# Patient Record
Sex: Male | Born: 1965 | Race: White | Hispanic: No | Marital: Single | Smoking: Current every day smoker
Health system: Southern US, Community
[De-identification: ages and names within clinical notes are randomized; demographics above are authoritative.]

## PROBLEM LIST (undated history)

## (undated) HISTORY — PX: ABDOMINAL SURGERY: SHX537

---

## 2013-11-08 ENCOUNTER — Emergency Department (HOSPITAL_COMMUNITY)
Admission: EM | Admit: 2013-11-08 | Discharge: 2013-11-08 | Disposition: A | Discharge status: Home / Self Care | Payer: Self-pay | Attending: Emergency Medicine | Admitting: Emergency Medicine

## 2013-11-08 ENCOUNTER — Encounter (HOSPITAL_COMMUNITY): Payer: Self-pay | Admitting: Emergency Medicine

## 2013-11-08 ENCOUNTER — Emergency Department (HOSPITAL_COMMUNITY): Payer: Self-pay

## 2013-11-08 DIAGNOSIS — Y929 Unspecified place or not applicable: Secondary | ICD-10-CM | POA: Insufficient documentation

## 2013-11-08 DIAGNOSIS — W11XXXA Fall on and from ladder, initial encounter: Secondary | ICD-10-CM | POA: Insufficient documentation

## 2013-11-08 DIAGNOSIS — F17200 Nicotine dependence, unspecified, uncomplicated: Secondary | ICD-10-CM | POA: Insufficient documentation

## 2013-11-08 DIAGNOSIS — W19XXXA Unspecified fall, initial encounter: Secondary | ICD-10-CM

## 2013-11-08 DIAGNOSIS — Y9389 Activity, other specified: Secondary | ICD-10-CM | POA: Insufficient documentation

## 2013-11-08 DIAGNOSIS — S300XXA Contusion of lower back and pelvis, initial encounter: Secondary | ICD-10-CM

## 2013-11-08 DIAGNOSIS — S20229A Contusion of unspecified back wall of thorax, initial encounter: Secondary | ICD-10-CM | POA: Insufficient documentation

## 2013-11-08 MED ORDER — CYCLOBENZAPRINE HCL 10 MG PO TABS: 10.0000 mg | ORAL_TABLET | Freq: Two times a day (BID) | ORAL | Status: DC | PRN

## 2013-11-08 MED ORDER — HYDROCODONE-ACETAMINOPHEN 5-325 MG PO TABS
1.0000 | ORAL_TABLET | Freq: Once | ORAL | Status: AC
  Administered 2013-11-08: 1 via ORAL
  Filled 2013-11-08: qty 1

## 2013-11-08 MED ORDER — HYDROCODONE-ACETAMINOPHEN 5-325 MG PO TABS: 2.0000 | ORAL_TABLET | ORAL | Status: DC | PRN

## 2013-11-08 NOTE — ED Notes (Signed)
Pt states he fell from approximately 5 foot tall ladder around 1800. Pt states struck lower back against unknown object. C/o increased pain in lower spin

## 2013-11-08 NOTE — ED Provider Notes (Signed)
CSN: [id number]     Arrival date & time 11/08/13  1191 History   First MD Initiated Contact with Patient 11/08/13 0559     Chief Complaint  Patient presents with  . Fall   (Consider location/radiation/quality/duration/timing/severity/associated sxs/prior Treatment) HPI Comments: The patient is a 47 y.o. male presenting the Emergency Department with a chief complaint of fall from a ladder yesterday.  The patient reports he was trying to hang Christmas lights when he lost his balance and fell from 4 feet height.  He reports hitting his lower back on a tree root.  He reports an increase in pain since last night. He denies hitting his head or LOC.  He denies other injury.  He reports he has been able to urinate and has had a bowel movement yesterday.    Patient is a 47 y.o. male presenting with fall. The history is provided by the patient. No language interpreter was used.  Fall This is a new problem. The current episode started yesterday. The problem occurs rarely. The problem has been gradually worsening. Pertinent negatives include no abdominal pain, change in bowel habit, chest pain, chills, coughing, fever, headaches, nausea, neck pain, numbness, rash, urinary symptoms, vertigo, visual change, vomiting or weakness. The symptoms are aggravated by walking, stress, twisting and bending. He has tried NSAIDs for the symptoms. The treatment provided no relief.    History reviewed. No pertinent past medical history. History reviewed. No pertinent past surgical history. No family history on file. History  Substance Use Topics  . Smoking status: Current Every Day Smoker -- 0.50 packs/day    Types: Cigarettes  . Smokeless tobacco: Not on file  . Alcohol Use: Not on file    Review of Systems  Constitutional: Negative for fever and chills.  Respiratory: Negative for cough.   Cardiovascular: Negative for chest pain and leg swelling.  Gastrointestinal: Negative for nausea, vomiting, abdominal  pain, diarrhea, constipation and change in bowel habit.  Musculoskeletal: Positive for back pain. Negative for neck pain.  Skin: Negative for rash.  Neurological: Negative for vertigo, syncope, weakness, numbness and headaches.  All other systems reviewed and are negative.    Allergies  Review of patient's allergies indicates not on file.  Home Medications   Current Outpatient Rx  Name  Route  Sig  Dispense  Refill  . cyclobenzaprine (FLEXERIL) 10 MG tablet   Oral   Take 1 tablet (10 mg total) by mouth 2 (two) times daily as needed for muscle spasms.   20 tablet   0   . HYDROcodone-acetaminophen (NORCO/VICODIN) 5-325 MG per tablet   Oral   Take 2 tablets by mouth every 4 (four) hours as needed.   10 tablet   0   . ibuprofen (ADVIL,MOTRIN) 200 MG tablet   Oral   Take 600 mg by mouth every 6 (six) hours as needed for mild pain or moderate pain.          BP 124/83  Pulse 76  Resp 18  Ht 5\' 7"  (1.702 m)  Wt 165 lb (74.844 kg)  BMI 25.84 kg/m2  SpO2 98% Physical Exam  Nursing note and vitals reviewed. Constitutional: He is oriented to person, place, and time. He appears well-developed and well-nourished. No distress.  Appears uncomfortable  HENT:  Head: Normocephalic and atraumatic.  Eyes: EOM are normal. Pupils are equal, round, and reactive to light. No scleral icterus.  Neck: Normal range of motion. Neck supple.  Cardiovascular: Normal rate, regular rhythm and normal heart  sounds.   No murmur heard. Not tachycardic on exam  Pulmonary/Chest: Effort normal and breath sounds normal. He has no wheezes.  Abdominal: Soft. Bowel sounds are normal. There is no tenderness. There is no rebound and no guarding.  Musculoskeletal: Normal range of motion. He exhibits no edema.       Lumbar back: He exhibits tenderness and bony tenderness. He exhibits no swelling, no edema and no deformity.       Back:  Neurological: He is alert and oriented to person, place, and time. No  sensory deficit.  Reflex Scores:      Patellar reflexes are 2+ on the right side and 2+ on the left side.      Achilles reflexes are 2+ on the right side and 2+ on the left side. Good strength in lower extremities.  No sensory deficit, Good DTR.  Skin: Skin is warm and dry. No rash noted.  Psychiatric: He has a normal mood and affect.    ED Course  Procedures (including critical care time) Labs Review Labs Reviewed - No data to display Imaging Review No results found.  EKG Interpretation   None       MDM   1. Fall, initial encounter   2. Contusion of lower back, initial encounter    Pt with a mechanical fall, No red flags on exam.  XR without evidence of fracture or malalignment. Pain medication, muscle relaxants and return precaution given. Discussed imaging results, and treatment plan with the patient.  He reports understanding and no other concerns at this time.  Patient is stable for discharge at this time.     Clabe Seal, PA-C 11/10/13 [phone]

## 2013-11-08 NOTE — ED Notes (Signed)
PA at bedside.

## 2013-11-15 NOTE — ED Provider Notes (Signed)
Medical screening examination/treatment/procedure(s) were performed by non-physician practitioner and as supervising physician I was immediately available for consultation/collaboration.  EKG Interpretation   None        Kenishia Plack K Edelmiro Innocent-Rasch, MD 11/15/13 [phone]

## 2014-08-20 ENCOUNTER — Encounter (HOSPITAL_COMMUNITY): Payer: Self-pay | Admitting: Emergency Medicine

## 2014-08-20 ENCOUNTER — Emergency Department (HOSPITAL_COMMUNITY)
Admission: EM | Admit: 2014-08-20 | Discharge: 2014-08-20 | Disposition: A | Discharge status: Home / Self Care | Payer: Self-pay | Attending: Emergency Medicine | Admitting: Emergency Medicine

## 2014-08-20 DIAGNOSIS — Y9389 Activity, other specified: Secondary | ICD-10-CM | POA: Insufficient documentation

## 2014-08-20 DIAGNOSIS — F17200 Nicotine dependence, unspecified, uncomplicated: Secondary | ICD-10-CM | POA: Insufficient documentation

## 2014-08-20 DIAGNOSIS — X503XXA Overexertion from repetitive movements, initial encounter: Secondary | ICD-10-CM | POA: Insufficient documentation

## 2014-08-20 DIAGNOSIS — IMO0002 Reserved for concepts with insufficient information to code with codable children: Secondary | ICD-10-CM | POA: Insufficient documentation

## 2014-08-20 DIAGNOSIS — X500XXA Overexertion from strenuous movement or load, initial encounter: Secondary | ICD-10-CM | POA: Insufficient documentation

## 2014-08-20 DIAGNOSIS — M545 Low back pain, unspecified: Secondary | ICD-10-CM

## 2014-08-20 DIAGNOSIS — Y9289 Other specified places as the place of occurrence of the external cause: Secondary | ICD-10-CM | POA: Insufficient documentation

## 2014-08-20 DIAGNOSIS — W108XXA Fall (on) (from) other stairs and steps, initial encounter: Secondary | ICD-10-CM | POA: Insufficient documentation

## 2014-08-20 MED ORDER — OXYCODONE-ACETAMINOPHEN 5-325 MG PO TABS: 1.0000 | ORAL_TABLET | Freq: Four times a day (QID) | ORAL | Status: AC | PRN

## 2014-08-20 MED ORDER — OXYCODONE-ACETAMINOPHEN 5-325 MG PO TABS
2.0000 | ORAL_TABLET | Freq: Once | ORAL | Status: AC
  Administered 2014-08-20: 2 via ORAL
  Filled 2014-08-20: qty 2

## 2014-08-20 MED ORDER — DIAZEPAM 5 MG PO TABS
10.0000 mg | ORAL_TABLET | Freq: Once | ORAL | Status: AC
  Administered 2014-08-20: 10 mg via ORAL
  Filled 2014-08-20: qty 2

## 2014-08-20 MED ORDER — ONDANSETRON 8 MG PO TBDP
8.0000 mg | ORAL_TABLET | Freq: Once | ORAL | Status: AC
  Administered 2014-08-20: 8 mg via ORAL
  Filled 2014-08-20: qty 1

## 2014-08-20 MED ORDER — DIAZEPAM 5 MG PO TABS: 5.0000 mg | ORAL_TABLET | Freq: Two times a day (BID) | ORAL | Status: AC

## 2014-08-20 MED ORDER — ONDANSETRON HCL 4 MG PO TABS: 4.0000 mg | ORAL_TABLET | Freq: Four times a day (QID) | ORAL | Status: AC

## 2014-08-20 NOTE — Discharge Instructions (Signed)
Back Pain, Adult °Low back pain is very common. About 1 in 5 people have back pain. The cause of low back pain is rarely dangerous. The pain often gets better over time. About half of people with a sudden onset of back pain feel better in just 2 weeks. About 8 in 10 people feel better by 6 weeks.  °CAUSES °Some common causes of back pain include: °· Strain of the muscles or ligaments supporting the spine. °· Wear and tear (degeneration) of the spinal discs. °· Arthritis. °· Direct injury to the back. °DIAGNOSIS °Most of the time, the direct cause of low back pain is not known. However, back pain can be treated effectively even when the exact cause of the pain is unknown. Answering your caregiver's questions about your overall health and symptoms is one of the most accurate ways to make sure the cause of your pain is not dangerous. If your caregiver needs more information, he or she may order lab work or imaging tests (X-rays or MRIs). However, even if imaging tests show changes in your back, this usually does not require surgery. °HOME CARE INSTRUCTIONS °For many people, back pain returns. Since low back pain is rarely dangerous, it is often a condition that people can learn to manage on their own.  °· Remain active. It is stressful on the back to sit or stand in one place. Do not sit, drive, or stand in one place for more than 30 minutes at a time. Take short walks on level surfaces as soon as pain allows. Try to increase the length of time you walk each day. °· Do not stay in bed. Resting more than 1 or 2 days can delay your recovery. °· Do not avoid exercise or work. Your body is made to move. It is not dangerous to be active, even though your back may hurt. Your back will likely heal faster if you return to being active before your pain is gone. °· Pay attention to your body when you  bend and lift. Many people have less discomfort when lifting if they bend their knees, keep the load close to their bodies, and  avoid twisting. Often, the most comfortable positions are those that put less stress on your recovering back. °· Find a comfortable position to sleep. Use a firm mattress and lie on your side with your knees slightly bent. If you lie on your back, put a pillow under your knees. °· Only take over-the-counter or prescription medicines as directed by your caregiver. Over-the-counter medicines to reduce pain and inflammation are often the most helpful. Your caregiver may prescribe muscle relaxant drugs. These medicines help dull your pain so you can more quickly return to your normal activities and healthy exercise. °· Put ice on the injured area. °¨ Put ice in a plastic bag. °¨ Place a towel between your skin and the bag. °¨ Leave the ice on for 15-20 minutes, 03-04 times a day for the first 2 to 3 days. After that, ice and heat may be alternated to reduce pain and spasms. °· Ask your caregiver about trying back exercises and gentle massage. This may be of some benefit. °· Avoid feeling anxious or stressed. Stress increases muscle tension and can worsen back pain. It is important to recognize when you are anxious or stressed and learn ways to manage it. Exercise is a great option. °SEEK MEDICAL CARE IF: °· You have pain that is not relieved with rest or medicine. °· You have pain that does not improve in 1 week. °· You have new symptoms. °· You are generally not feeling well. °SEEK   IMMEDIATE MEDICAL CARE IF:  °· You have pain that radiates from your back into your legs. °· You develop new bowel or bladder control problems. °· You have unusual weakness or numbness in your arms or legs. °· You develop nausea or vomiting. °· You develop abdominal pain. °· You feel faint. °Document Released: 11/15/2005 Document Revised: 05/16/2012 Document Reviewed: 03/19/2014 °Exit[employer]® Patient Information ©2015 [employer]. This information is not intended to replace advice given to you by your health care provider. Make sure you  discuss any questions you have with your health care provider. ° °Back Exercises °These exercises may help you when beginning to rehabilitate your injury. Your symptoms may resolve with or without further involvement from your physician, physical therapist or athletic trainer. While completing these exercises, remember:  °· Restoring tissue flexibility helps normal motion to return to the joints. This allows healthier, less painful movement and activity. °· An effective stretch should be held for at least 30 seconds. °· A stretch should never be painful. You should only feel a gentle lengthening or release in the stretched tissue. °STRETCH - Extension, Prone on Elbows  °· Lie on your stomach on the floor, a bed will be too soft. Place your palms about shoulder width apart and at the height of your head. °· Place your elbows under your shoulders. If this is too painful, stack pillows under your chest. °· Allow your body to relax so that your hips drop lower and make contact more completely with the floor. °· Hold this position for __________ seconds. °· Slowly return to lying flat on the floor. °Repeat __________ times. Complete this exercise __________ times per day.  °RANGE OF MOTION - Extension, Prone Press Ups  °· Lie on your stomach on the floor, a bed will be too soft. Place your palms about shoulder width apart and at the height of your head. °· Keeping your back as relaxed as possible, slowly straighten your elbows while keeping your hips on the floor. You may adjust the placement of your hands to maximize your comfort. As you gain motion, your hands will come more underneath your shoulders. °· Hold this position __________ seconds. °· Slowly return to lying flat on the floor. °Repeat __________ times. Complete this exercise __________ times per day.  °RANGE OF MOTION- Quadruped, Neutral Spine  °· Assume a hands and knees position on a firm surface. Keep your hands under your shoulders and your knees under  your hips. You may place padding under your knees for comfort. °· Drop your head and point your tail bone toward the ground below you. This will round out your low back like an angry cat. Hold this position for __________ seconds. °· Slowly lift your head and release your tail bone so that your back sags into a large arch, like an old horse. °· Hold this position for __________ seconds. °· Repeat this until you feel limber in your low back. °· Now, find your "sweet spot." This will be the most comfortable position somewhere between the two previous positions. This is your neutral spine. Once you have found this position, tense your stomach muscles to support your low back. °· Hold this position for __________ seconds. °Repeat __________ times. Complete this exercise __________ times per day.  °STRETCH - Flexion, Single Knee to Chest  °· Lie on a firm bed or floor with both legs extended in front of you. °· Keeping one leg in contact with the floor, bring your opposite knee to your   chest. Hold your leg in place by either grabbing behind your thigh or at your knee. °· Pull until you feel a gentle stretch in your low back. Hold __________ seconds. °· Slowly release your grasp and repeat the exercise with the opposite side. °Repeat __________ times. Complete this exercise __________ times per day.  °STRETCH - Hamstrings, Standing °· Stand or sit and extend your right / left leg, placing your foot on a chair or foot stool °· Keeping a slight arch in your low back and your hips straight forward. °· Lead with your chest and lean forward at the waist until you feel a gentle stretch in the back of your right / left knee or thigh. (When done correctly, this exercise requires leaning only a small distance.) °· Hold this position for __________ seconds. °Repeat __________ times. Complete this stretch __________ times per day. °STRENGTHENING - Deep Abdominals, Pelvic Tilt  °· Lie on a firm bed or floor. Keeping your legs in  front of you, bend your knees so they are both pointed toward the ceiling and your feet are flat on the floor. °· Tense your lower abdominal muscles to press your low back into the floor. This motion will rotate your pelvis so that your tail bone is scooping upwards rather than pointing at your feet or into the floor. °· With a gentle tension and even breathing, hold this position for __________ seconds. °Repeat __________ times. Complete this exercise __________ times per day.  °STRENGTHENING - Abdominals, Crunches  °· Lie on a firm bed or floor. Keeping your legs in front of you, bend your knees so they are both pointed toward the ceiling and your feet are flat on the floor. Cross your arms over your chest. °· Slightly tip your chin down without bending your neck. °· Tense your abdominals and slowly lift your trunk high enough to just clear your shoulder blades. Lifting higher can put excessive stress on the low back and does not further strengthen your abdominal muscles. °· Control your return to the starting position. °Repeat __________ times. Complete this exercise __________ times per day.  °STRENGTHENING - Quadruped, Opposite UE/LE Lift  °· Assume a hands and knees position on a firm surface. Keep your hands under your shoulders and your knees under your hips. You may place padding under your knees for comfort. °· Find your neutral spine and gently tense your abdominal muscles so that you can maintain this position. Your shoulders and hips should form a rectangle that is parallel with the floor and is not twisted. °· Keeping your trunk steady, lift your right hand no higher than your shoulder and then your left leg no higher than your hip. Make sure you are not holding your breath. Hold this position __________ seconds. °· Continuing to keep your abdominal muscles tense and your back steady, slowly return to your starting position. Repeat with the opposite arm and leg. °Repeat __________ times. Complete this  exercise __________ times per day. °Document Released: 12/03/2005 Document Revised: 02/07/2012 Document Reviewed: 02/27/2009 °[employer]® Patient Information ©2015 [employer]. This information is not intended to replace advice given to you by your health care provider. Make sure you discuss any questions you have with your health care provider. ° °

## 2014-08-20 NOTE — ED Notes (Signed)
Pt reports l/low back pain 1 hour after lifting a heavy dresser. Treated with Tylenol

## 2014-08-20 NOTE — ED Provider Notes (Signed)
CSN: [id number]     Arrival date & time 08/20/14  1516 History  This chart was scribed for non-physician practitioner, Junious Silk, PA-C working with Rolland Porter, MD by Greggory Stallion, ED scribe. This patient was seen in room WTR7/WTR7 and the patient's care was started at 4:30 PM.   Chief Complaint  Patient presents with  . Back Pain    pain on l/side of low back   The history is provided by the patient. No language interpreter was used.   HPI Comments: Angel Cook is a 48 y.o. male who presents to the Emergency Department complaining of sharp, aching left lower back pain that started about two hours ago. Pain does not radiate into his leg. States he was carrying a heavy dresser down steps, missed a step and let the dresser fall. Pt states he fell onto his knees but nothing else. He is not currently having knee pain. He has taken tylenol with no relief. Movements worsen the pain. Denies bowel or bladder incontinence. Denies history of drug use. Reports skin lesion on face in June 2015 that he states was removed by scraping.   History reviewed. No pertinent past medical history. Past Surgical History  Procedure Laterality Date  . Abdominal surgery     Family History  Problem Relation Age of Onset  . Cancer Father   . Cancer Other    History  Substance Use Topics  . Smoking status: Current Every Day Smoker -- 0.50 packs/day    Types: Cigarettes  . Smokeless tobacco: Not on file  . Alcohol Use: No    Review of Systems  Genitourinary:       Negative for bowel or bladder incontinence.  Musculoskeletal: Positive for back pain.  All other systems reviewed and are negative.  Allergies  Naproxen and Toradol  Home Medications   Prior to Admission medications   Medication Sig Start Date End Date Taking? Authorizing Provider  acetaminophen (TYLENOL) 500 MG tablet Take 500 mg by mouth every 6 (six) hours as needed for moderate pain.   Yes Historical Provider, MD   BP 126/79   Pulse 78  Temp(Src) 98.3 F (36.8 C) (Oral)  Wt 160 lb (72.576 kg)  SpO2 100%  Physical Exam  Nursing note and vitals reviewed. Constitutional: He is oriented to person, place, and time. He appears well-developed and well-nourished. No distress.  HENT:  Head: Normocephalic and atraumatic.  Right Ear: External ear normal.  Left Ear: External ear normal.  Nose: Nose normal.  Eyes: Conjunctivae are normal.  Neck: Normal range of motion. No tracheal deviation present.  Cardiovascular: Normal rate, regular rhythm and normal heart sounds.   Pulses:      Posterior tibial pulses are 2+ on the right side, and 2+ on the left side.  Pulmonary/Chest: Effort normal and breath sounds normal. No stridor.  Abdominal: Soft. He exhibits no distension. There is no tenderness.  Musculoskeletal: Normal range of motion.  Tender to palpation to left lower back. No bony tenderness. No deformities or step offs. Strength 5/5 in lower extremities bilaterally.   Neurological: He is alert and oriented to person, place, and time.  Antalgic gait.  Skin: Skin is warm and dry. He is not diaphoretic.  No rashes or lesions to back.  Psychiatric: He has a normal mood and affect. His behavior is normal.    ED Course  Procedures (including critical care time)  DIAGNOSTIC STUDIES: Oxygen Saturation is 100% on RA, normal by my interpretation.  COORDINATION OF CARE: 4:32 PM-Advised pt xray is not necessary and he agrees. Discussed treatment plan which includes pain medication and a muscle relaxer with pt at bedside and pt agreed to plan. Return precautions given.   Labs Review Labs Reviewed - No data to display  Imaging Review No results found.   EKG Interpretation None      MDM   Final diagnoses:  Left-sided low back pain without sciatica    Patient with back pain.  No neurological deficits and normal neuro exam.  Patient can walk but states is painful.  No loss of bowel or bladder control.  No  concern for cauda equina.  No fever, night sweats, weight loss, IVDU.  RICE protocol and pain medicine indicated and discussed with patient.    I personally performed the services described in this documentation, which was scribed in my presence. The recorded information has been reviewed and is accurate.  Mora Bellman, PA-C 08/24/14 [phone]

## 2014-08-25 NOTE — ED Provider Notes (Signed)
Medical screening examination/treatment/procedure(s) were performed by non-physician practitioner and as supervising physician I was immediately available for consultation/collaboration.   EKG Interpretation None        Cattleya Dobratz, MD 08/25/14 1521 

## 2015-04-11 IMAGING — CR DG LUMBAR SPINE COMPLETE 4+V
5 series · 5 of 5 positions shown · non-contrast
Comparison: None.

CLINICAL DATA: Status post fall; lower back pain and right leg
tingling.

EXAM:
LUMBAR SPINE - COMPLETE 4+ VIEW

[t lumbar spine ap]
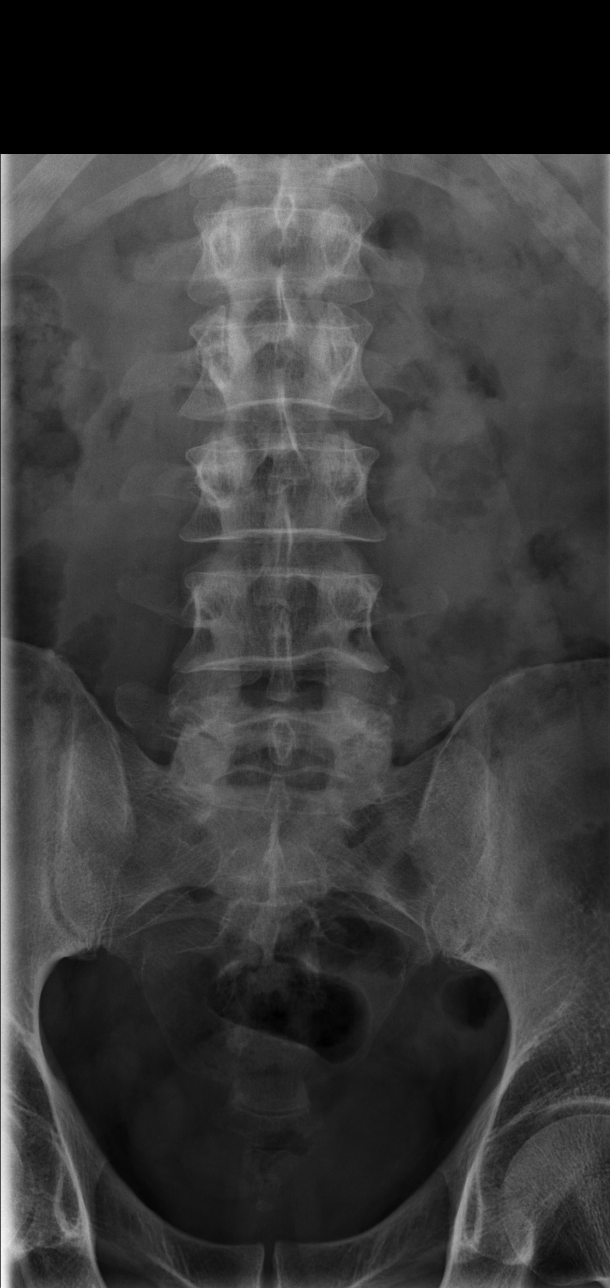

[t lumbar spine obl (1 of 2)]
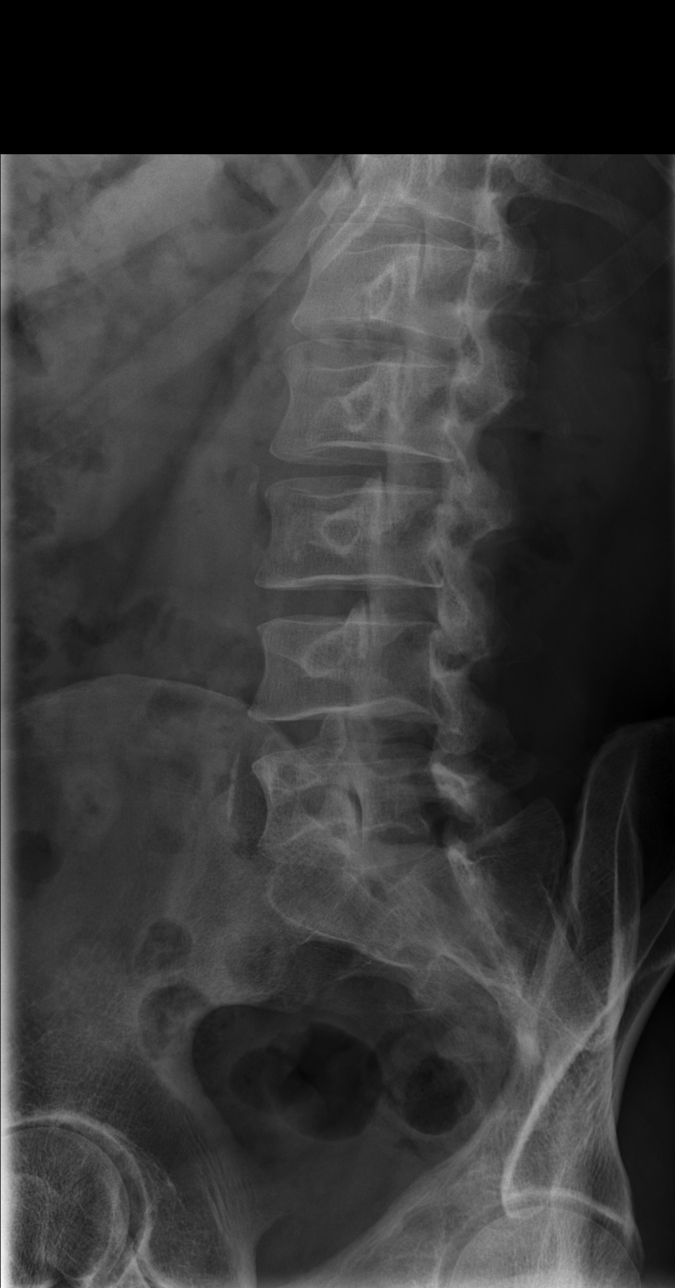

[t lumbar spine obl (2 of 2)]
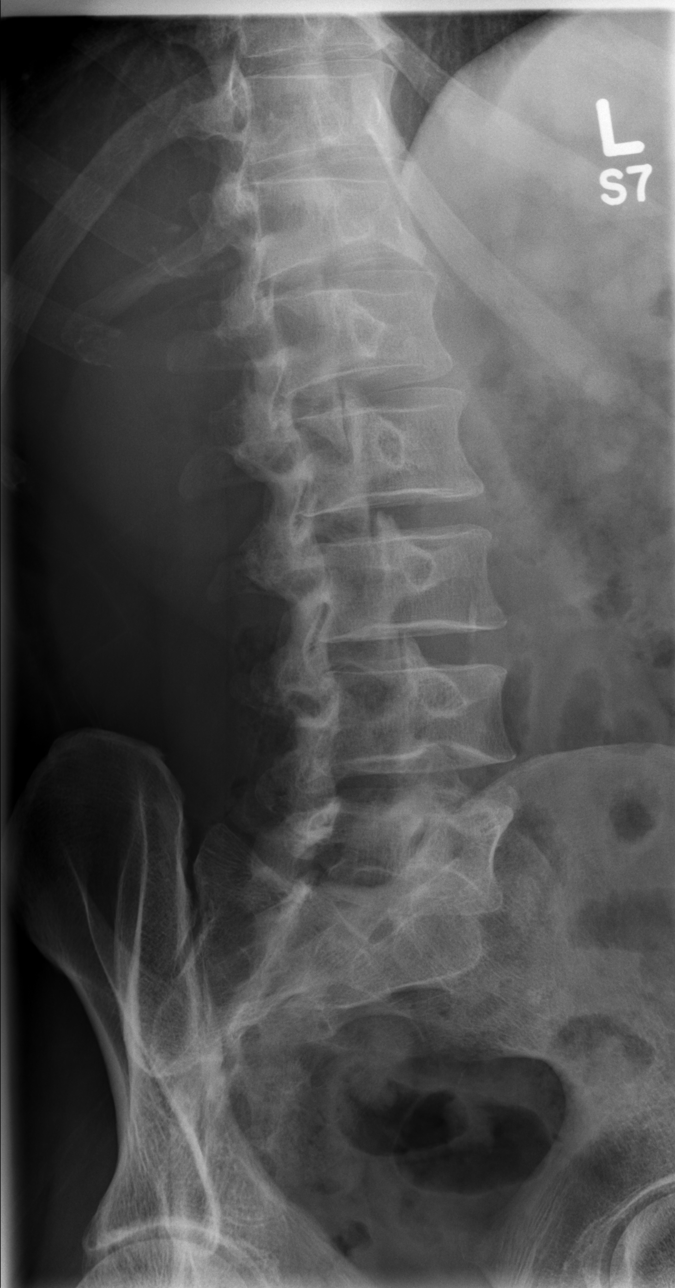

[t lumbar spine lat]
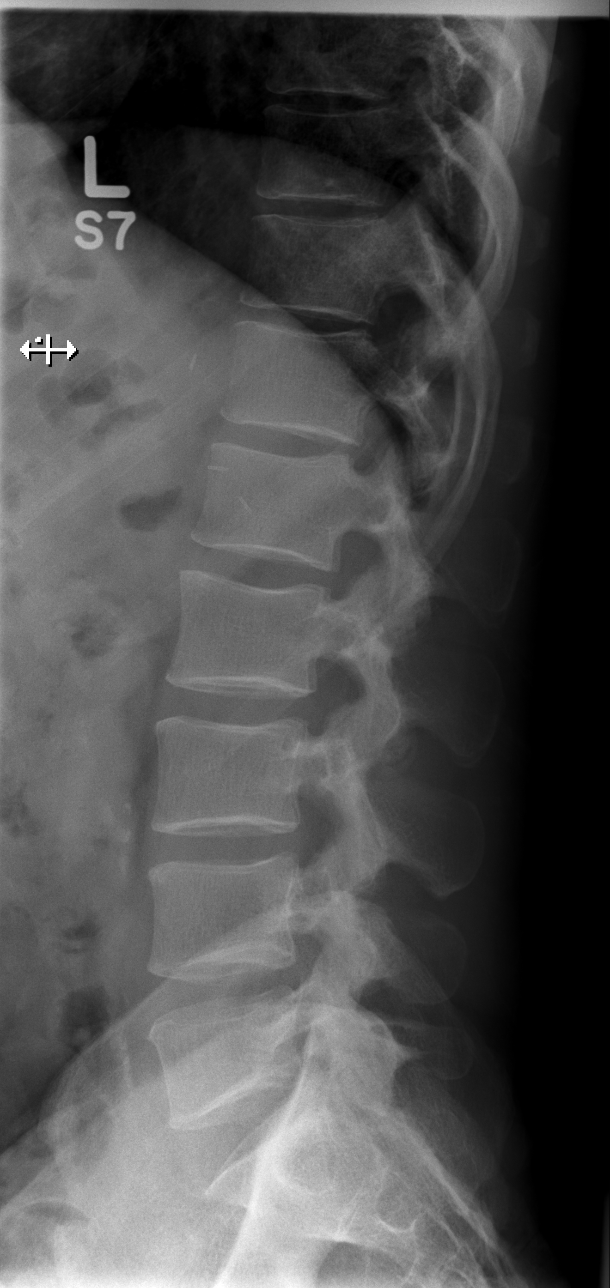

[t lumbar l-5 s-1 spot]
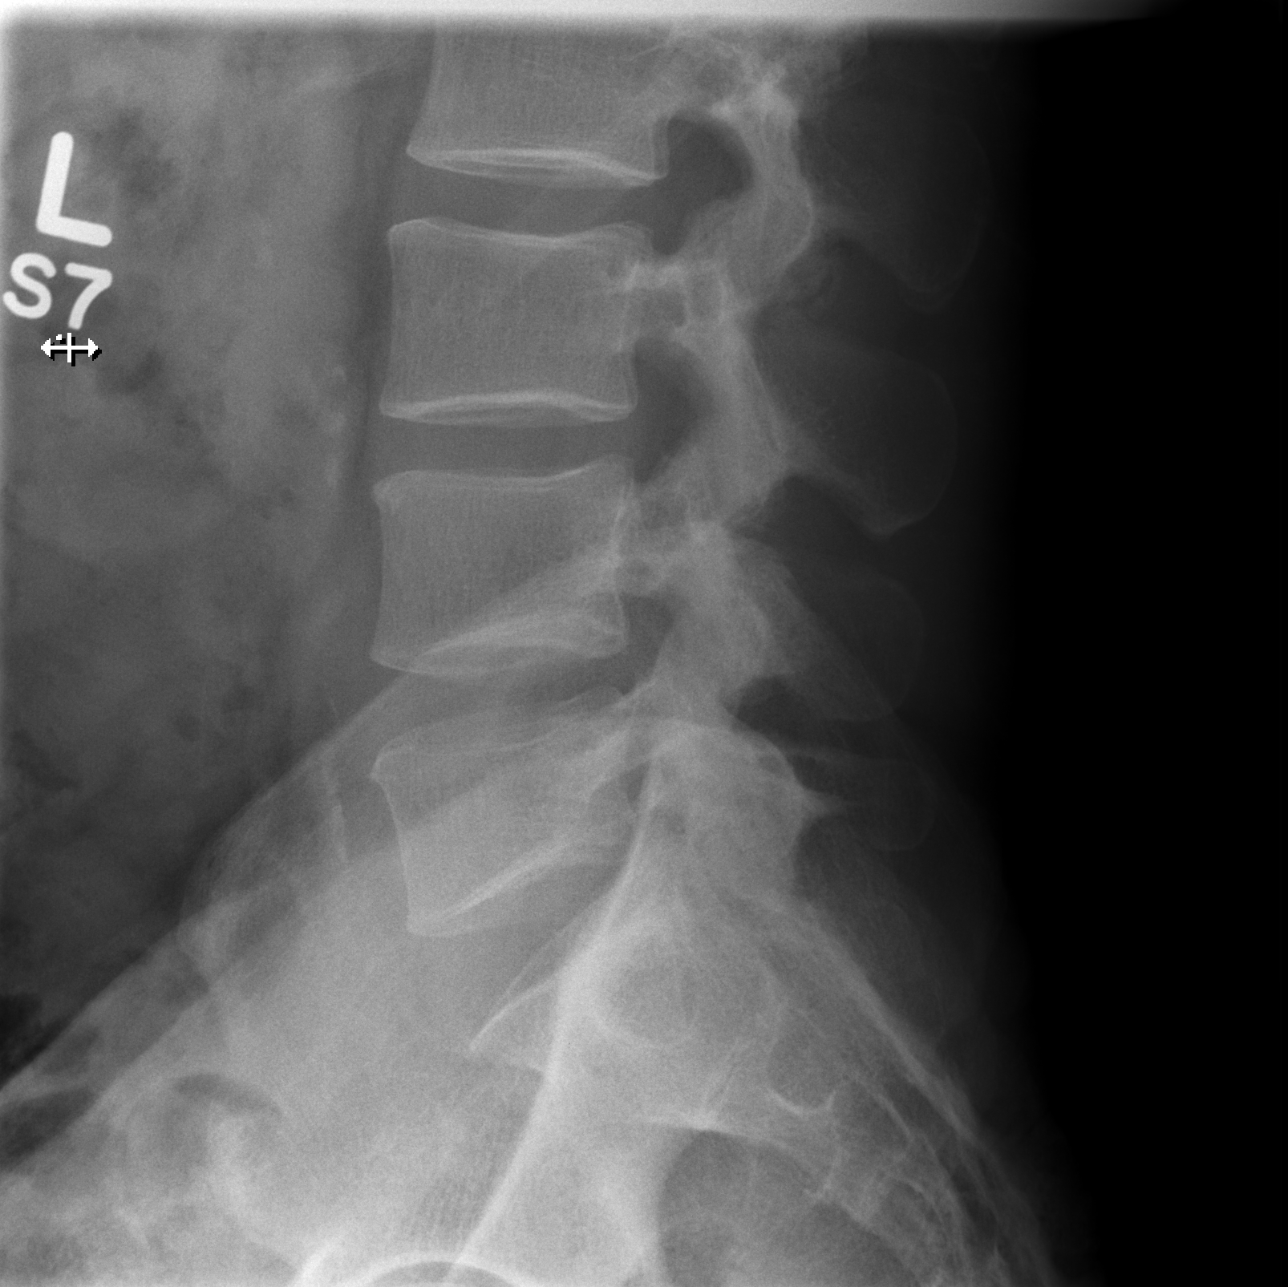

[5 of 5 positions shown; findings below may reference images not displayed]

FINDINGS: There is no evidence of fracture or subluxation. Vertebral bodies
demonstrate normal height and alignment. Intervertebral disc spaces
are preserved. The visualized neural foramina are grossly
unremarkable in appearance.

The visualized bowel gas pattern is unremarkable in appearance; air
and stool are noted within the colon. The sacroiliac joints are
within normal limits.
IMPRESSION: No evidence of fracture or subluxation along the lumbar spine.
